# Patient Record
Sex: Female | Born: 2000 | Race: White | Hispanic: No | Marital: Single | Smoking: Never smoker
Health system: Southern US, Community
[De-identification: ages and names within clinical notes are randomized; demographics above are authoritative.]

---
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
## 2021-02-05 ENCOUNTER — Emergency Department: Payer: BC Managed Care – PPO

## 2021-02-05 ENCOUNTER — Emergency Department
Admission: EM | Admit: 2021-02-05 | Discharge: 2021-02-05 | Disposition: A | Discharge status: Home / Self Care | Payer: BC Managed Care – PPO | Attending: Emergency Medical Services | Admitting: Emergency Medical Services

## 2021-02-05 DIAGNOSIS — X58XXXA Exposure to other specified factors, initial encounter: Secondary | ICD-10-CM | POA: Insufficient documentation

## 2021-02-05 DIAGNOSIS — M84375A Stress fracture, left foot, initial encounter for fracture: Secondary | ICD-10-CM | POA: Insufficient documentation

## 2021-02-05 DIAGNOSIS — S93602A Unspecified sprain of left foot, initial encounter: Secondary | ICD-10-CM | POA: Insufficient documentation

## 2021-02-05 NOTE — Discharge Instructions (Addendum)
Ibuprofen 600mg  every 8 hours with food only as needed for pain.  Consider cushioned in-soles for shoes.  Rest, ice, elevation as discussed.  Call/see Podiatry as needed.    Thank you for choosing [facility] for your emergency care needs.  We strive to provide EXCELLENT care to you and your family.      If you do not continue to improve or your condition worsens, please contact your doctor or return immediately to the Emergency Department.    ONSITE PHARMACY  Our full service onsite pharmacy is a 2 minute walk from the ER.  Open Mon to Fri from 8 am to 8 pm, Sat and Sun 9 am to 5 pm. Ask an ED staff member for directions.  We accept all major insurances and prices are competitive with major retailers.  Ask your provider to print your prescriptions down to the pharmacy to speed you on your way home.      DOCTOR REFERRALS  Call [phone] (available 24 hours a day, 7 days a week) if you need any further referrals and we can help you find a primary care doctor or specialist.  Also, available online at:  [email or web address]    YOUR CONTACT INFORMATION  Before leaving please check with registration to make sure we have an up-to-date contact number.  You can call registration at [phone] to update your information.  For questions about your hospital bill, please call [phone].  For questions about your Emergency Dept Physician bill please call [phone].      FREE HEALTH SERVICES  If you need help with health or social services, please call 2-1-1 for a free referral to resources in your area.  2-1-1 is a free service connecting people with information on health insurance, free clinics, pregnancy, mental health, dental care, food assistance, housing, and substance abuse counseling.  Also, available online at:  http://www.[facility].org    MEDICAL RECORDS AND TESTS  Certain laboratory test results do not come back the same day, for example urine cultures.   We will  contact you if other important findings are noted.  Radiology films are often reviewed again to ensure accuracy.  If there is any discrepancy, we will notify you.      Please call [phone] to pick up a complimentary CD of any radiology studies performed.  If you or your doctor would like to request a copy of your medical records, please call [phone].          ORTHOPEDIC INJURY   Please know that significant injuries can exist even when an initial x-ray is read as normal or negative.  This can occur because some fractures (broken bones) are not initially visible on x-rays.  For this reason, close outpatient follow-up with your primary care doctor or bone specialist (orthopedist) is required.    MEDICATIONS AND FOLLOWUP  Please be aware that some prescription medications can cause drowsiness.  Use caution when driving or operating machinery.    The examination and treatment you have received in our Emergency Department is provided on an emergency basis, and is not intended to be a substitute for your primary care physician.  It is important that your doctor checks you again and that you report any new or remaining problems at that time.      24 HOUR PHARMACIES  [facility] [address], [place], [place] [address] (1.4 miles, 7 minutes)  Handout with directions available on request  ASSISTANCE WITH INSURANCE    Affordable Care Act  (ACA)  Call to start or finish an application, compare plans, enroll or ask a question.  1-800-318-2596  TTY: 1-855-889-4325  Web:  Healthcare.gov    Help Enrolling in Medicaid  Cover [place]  (855) 242-8282 (TOLL-FREE)  (888) 221-1590 (TTY)  Web:  Http://www.coverva.org    Local Help Enrolling in the ACA  [facility]  ([phone] 748-2580 (MAIN)  Email:  health-help@nvfs.org  Web:  Http://www.nvfs.org  Address:  [address] [place], [place] [address]

## 2021-02-05 NOTE — ED Provider Notes (Addendum)
EMERGENCY DEPARTMENT HISTORY AND PHYSICAL EXAM         [facility]        Patient Information       Patient Name: Pamela Montes, Pamela Montes  Encounter Date:  02/05/2021  Attending Physician: Alexander-Nicholas D. 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  Worse with ambulation and bearing weight.  There is no antecedent significant trauma.  No foot swelling.  No toe pain or swelling.  No pain at the heel or soles of feet.  No rash.  No fever or constitutional symptoms.  No ankle knee or hip pain.  To ambulate without assistance normally.      PMD:  Dexter-Rice, Tennis Must, MD      Nursing Notes Review   Nursing Notes were reviewed.     Previous Records Review   Previous records were reviewed to the extent practicable for the current presentation.      Review of Systems     Constitutional:  No fever  Eyes: No discharge   ENT: No ST  CV:  No CP   Resp:  No SOB or cough  GI: No abd pain, N, V  GU: No dysuria  MS:  +as per HPI  Skin: No rash  Neuro:  No HA  Psych:  No behavior changes  All other systems reviewed and negative    See HPI for review of systems that is relevant to the current presentation.   All other systems reviewed: negative.     Past Medical History     History reviewed. No pertinent past medical history.    Past Surgical History     History reviewed. No pertinent surgical history.    Family History     History reviewed. No pertinent family history.    Social History     She reports that she has never smoked. She has never been exposed to tobacco smoke. She has never used smokeless tobacco. She reports that she does not drink alcohol and does not use drugs.    History reviewed.  No pertinent family history.    Allergies     No Known Allergies    Home Medications     Home medications reviewed by ED MD at 12:26 PM    There are no discharge medications for this patient.        ED Medications Administered     ED Medication Orders (From admission, onward)      None              VS   No data found.      Physical Exam     Constitutional: Vital signs reviewed. Well appearing.  Head: Normocephalic, atraumatic  Eyes: Conjunctiva and sclera are normal.  No injection or discharge.   Ears, Nose, Throat:  Normal external examination of the nose and ears.    Neck: Normal range of motion. Trachea midline.   Respiratory/Chest:  No respiratory distress.     Cardiovascular: Regular rate and rhythm.   Abdomen: Soft  and non-distended  Upper Extremity:  No edema. No cyanosis. Nl ROM  Lower Extremity:  No edema. No cyanosis.  RLE:  nontender and FROM throughout  LLE: No hip tenderness.  Left knee is nontender with midline patella.  No effusion.  No laxity.  Lower leg compartments are soft.  Achilles tendon is normal.  No ankle swelling.  Midfoot and is tender.  No tenderness at the base of the fifth metatarsal.  Mild tenderness at the third fourth and fifth MTP joints but there is no swelling and no crepitus.  No erythema and warmth.  2+ distal pulses.  CSM intact.  Neurological:  Alert and conversant.  No focal motor deficits by observation; MAE, 5/5 strength x4 ext. Speech normal.  Skin: Warm and dry. No rash.  Psychiatric:  Normal affect.  Normal insight.        Diagnostic Study Results     ECG:      Labs     Results       ** No results found for the last 24 hours. **            Radiologic Studies  Radiology Results (24 Hour)       Procedure Component Value Units Date/Time    Foot Left AP Lateral and Oblique [[id number]] Collected: 02/05/21 1412    Order Status: Completed Updated: 02/05/21 1415    Narrative:      HISTORY: foot pain     COMPARISON: None.    FINDINGS:     There is some cortical thickening at the  medial aspect of the fourth  metatarsal diaphysis. There is anatomic alignment throughout with normal  joint spaces.      Impression:       Cortical thickening of the fourth metatarsal, consistent  with healing stress fracture    Genelle Bal   02/05/2021 2:13 PM        .    Data Review     Laboratory results reviewed by ED provider:  N/A  Radiologic study results reviewed by ED provider:  Yes    MDM and Clinical Notes                        Critical Care     na      Diagnosis and Disposition     Rendering Provider: Alexander-Nicholas Marina Gravel, MD    Clinical Impression(s):  1. Stress fracture of left foot, initial encounter    2. Foot sprain, left, initial encounter        Disposition  ED Disposition       ED Disposition   Discharge    Condition   --    Date/Time   Fri Feb 05, 2021  2:17 PM    Comment   Jennett Tarbell Grose discharge to home/self care.    Condition at disposition: Stable                 Prescriptions    There are no discharge medications for this patient.          *This note was generated by the Epic EMR system/ Dragon speech recognition and may contain inherent errors or omissions not intended by the user. Grammatical errors, random word insertions, deletions, pronoun errors and incomplete sentences are occasional consequences of this technology due to software limitations. Not all errors are caught or corrected. If there are questions or concerns about the content of this note or information contained  within the body of this dictation they should be addressed directly with the author for clarification.Terance Ice, Alexander-Nicholas D, MD  02/05/21 1430       Phi Avans, Alexander-Nicholas D, MD  03/11/21 [phone]

## 2021-02-05 NOTE — ED Triage Notes (Signed)
Pt reports reports right foot pain for a month but has been getting worse

## 2021-03-18 ENCOUNTER — Other Ambulatory Visit: Payer: Self-pay | Admitting: Foot and Ankle Surgery

## 2021-03-18 DIAGNOSIS — M25371 Other instability, right ankle: Secondary | ICD-10-CM

## 2021-03-18 DIAGNOSIS — S8261XS Displaced fracture of lateral malleolus of right fibula, sequela: Secondary | ICD-10-CM

## 2021-03-26 ENCOUNTER — Ambulatory Visit (INDEPENDENT_AMBULATORY_CARE_PROVIDER_SITE_OTHER): Payer: BC Managed Care – PPO | Admitting: Emergency Medicine

## 2021-03-26 ENCOUNTER — Encounter (INDEPENDENT_AMBULATORY_CARE_PROVIDER_SITE_OTHER): Payer: Self-pay | Admitting: Emergency Medicine

## 2021-03-26 VITALS — BP 118/74 | HR 98 | Temp 98.9°F | Resp 18 | Ht 64.0 in | Wt 145.0 lb

## 2021-03-26 DIAGNOSIS — R058 Other specified cough: Secondary | ICD-10-CM

## 2021-03-26 DIAGNOSIS — R051 Acute cough: Secondary | ICD-10-CM

## 2021-03-26 MED ORDER — ALBUTEROL SULFATE HFA 108 (90 BASE) MCG/ACT IN AERS
2.0000 | INHALATION_SPRAY | RESPIRATORY_TRACT | 0 refills | Status: AC | PRN
Start: 2021-03-26 — End: 2021-05-25

## 2021-03-26 MED ORDER — AMOXICILLIN-POT CLAVULANATE 875-125 MG PO TABS
1.0000 | ORAL_TABLET | Freq: Two times a day (BID) | ORAL | 0 refills | Status: AC
Start: 2021-03-26 — End: 2021-04-02

## 2021-03-26 NOTE — Progress Notes (Signed)
URGENT  CARE  PROGRESS NOTE     Patient: Pamela Montes   Date: 03/26/2021   MRN: [id number]       Pamela Montes is a 21 y.o. female      HISTORY     History obtained from: Patient    Chief Complaint   Patient presents with    Cough     Pt reports cough, congestion and ear pain x54mth        HPI   21 year old female presents to the clinic with 1 month of nasal congestion and cough, no recent fevers.  Some right ear pain.  Has been attempting relief with Mucinex and ibuprofen Profen without much success.  Denies history of allergies.      Review of Systems    History:    Pertinent Past Medical, Surgical, Family and Social History were reviewed.        Current Outpatient Medications:     albuterol sulfate HFA (Proventil HFA) 108 (90 Base) MCG/ACT inhaler, Inhale 2 puffs into the lungs every 4 (four) hours as needed for Wheezing or Shortness of Breath, Disp: 1 each, Rfl: 0    amoxicillin-clavulanate (AUGMENTIN) 875-125 MG per tablet, Take 1 tablet by mouth 2 (two) times daily for 7 days, Disp: 14 tablet, Rfl: 0    No Known Allergies    Medications and Allergies reviewed.    PHYSICAL EXAM     Vitals:    03/26/21 1333   BP: 118/74   BP Site: Left arm   Patient Position: Sitting   Cuff Size: Large   Pulse: 98   Resp: 18   Temp: 98.9 F (37.2 C)   TempSrc: Tympanic   SpO2: 99%   Weight: 65.8 kg (145 lb)   Height: 1.626 m (5\' 4" )       Physical Exam  Constitutional:       General: She is not in acute distress.     Appearance: She is well-developed.   HENT:      Head: Normocephalic and atraumatic.      Right Ear: Tympanic membrane and external ear normal.      Left Ear: Tympanic membrane and external ear normal.      Mouth/Throat:      Pharynx: No oropharyngeal exudate.     Eyes: Conjunctivae are normal. Right conjunctiva is not injected. Left conjunctiva is not injected. No scleral icterus. Cardiovascular:      Rate and Rhythm: Normal rate and regular rhythm.      Heart sounds: Normal heart sounds. No murmur  heard.  Pulmonary:      Effort: Pulmonary effort is normal. No respiratory distress.      Breath sounds: Normal breath sounds. No wheezing.   Musculoskeletal:      Cervical back: Normal range of motion and neck supple.   Lymphadenopathy:      Cervical: No cervical adenopathy.   Neurological:      Mental Status: She is alert and oriented to person, place, and time.   Skin:     General: Skin is warm and dry.      Findings: No rash.   Vitals and nursing note reviewed.       UCC COURSE     There were no labs reviewed with this patient during the visit.    There were no x-rays reviewed with this patient during the visit.    No current facility-administered medications for this visit.  PROCEDURES     Procedures    MEDICAL DECISION MAKING     History, physical, labs/studies most consistent with postviral cough syndrome as the diagnosis.    Chart Review:  Prior PCP, Specialist and/or ED notes reviewed today: Not Applicable  Prior labs/images/studies reviewed today: Not Applicable    Differential Diagnosis: Sinusitis, Bronchitis, Pharyngitis, Pneumonia, Influenza, COVID-19 and Allergic Rhinitis        ASSESSMENT     Encounter Diagnoses   Name Primary?    Acute cough Yes    Post-viral cough syndrome             PLAN      PLAN:     21 year old female presents the clinic with 1 month of URI symptoms.  There continues to be no evidence of a bacterial infection at this time.  She is prescribed albuterol inhaler to help with cough and recommend she continue Mucinex and start Afrin nasal spray for 3 to 4 days.  If there still is no improvement she has a prescription for Augmentin.  Follow-up primary care as needed      Orders Placed This Encounter   Procedures    [facility] ID Now SARS-COV-2 POCT     Requested Prescriptions     Signed Prescriptions Disp Refills    albuterol sulfate HFA (Proventil HFA) 108 (90 Base) MCG/ACT inhaler 1 each 0     Sig: Inhale 2 puffs into the lungs every 4 (four) hours as needed for Wheezing or  Shortness of Breath    amoxicillin-clavulanate (AUGMENTIN) 875-125 MG per tablet 14 tablet 0     Sig: Take 1 tablet by mouth 2 (two) times daily for 7 days       Discussed results and diagnosis with patient/family.  Reviewed warning signs for worsening condition, as well as, indications for follow-up with primary care physician and return to urgent care clinic.   Patient/family expressed understanding of instructions.     An After Visit Summary was provided to the patient.

## 2021-04-14 IMAGING — CR DX Fingers RT
1 series · 3 of 3 positions shown · non-contrast
Comparison: None

HISTORY: Status post injury
TECHNIQUE: 3 views of the right second finger

[Series 2: pa · 0.10mm/px · 3 of 3 slices shown]
[im 1/3]
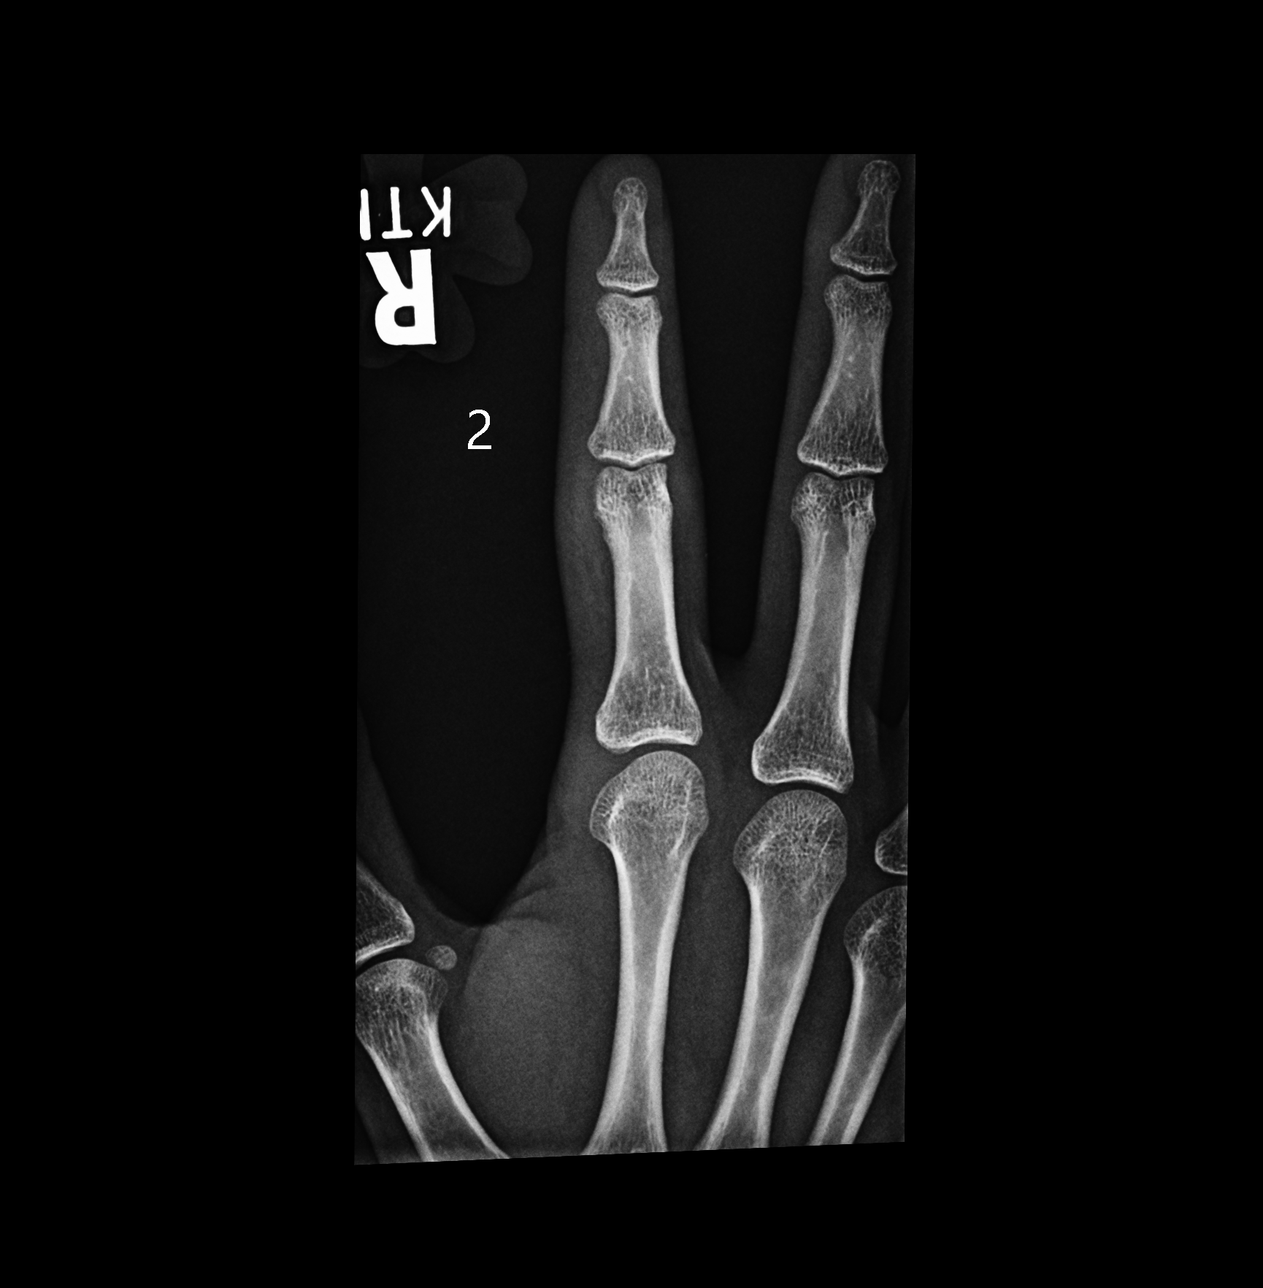
[im 2/3]
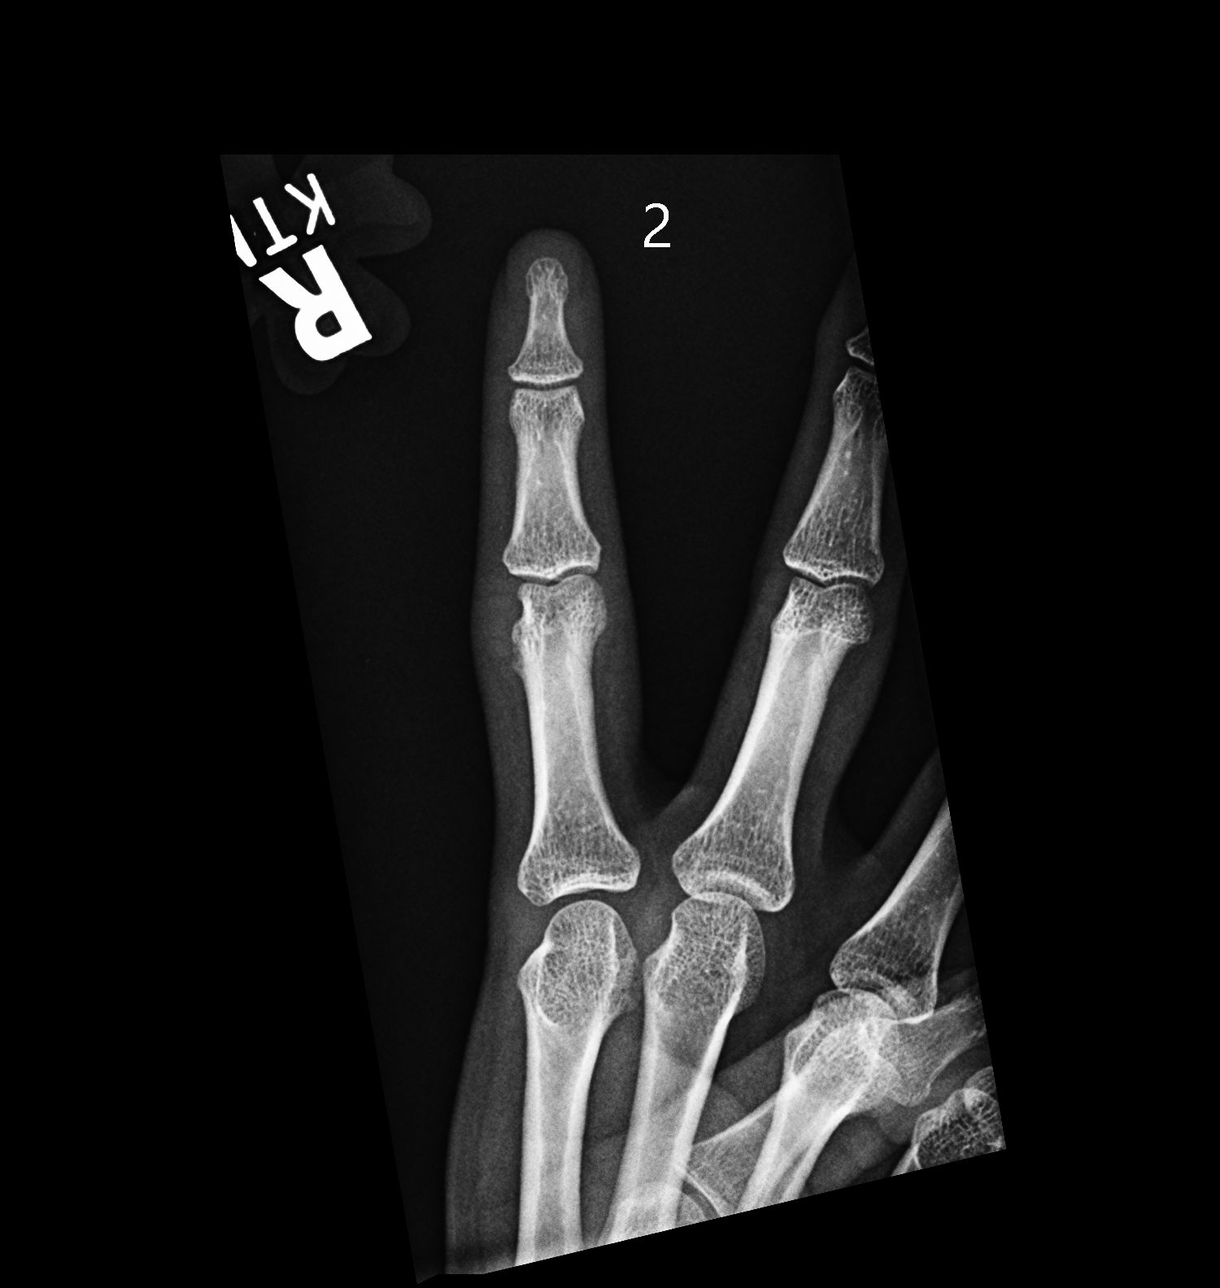
[im 3/3]
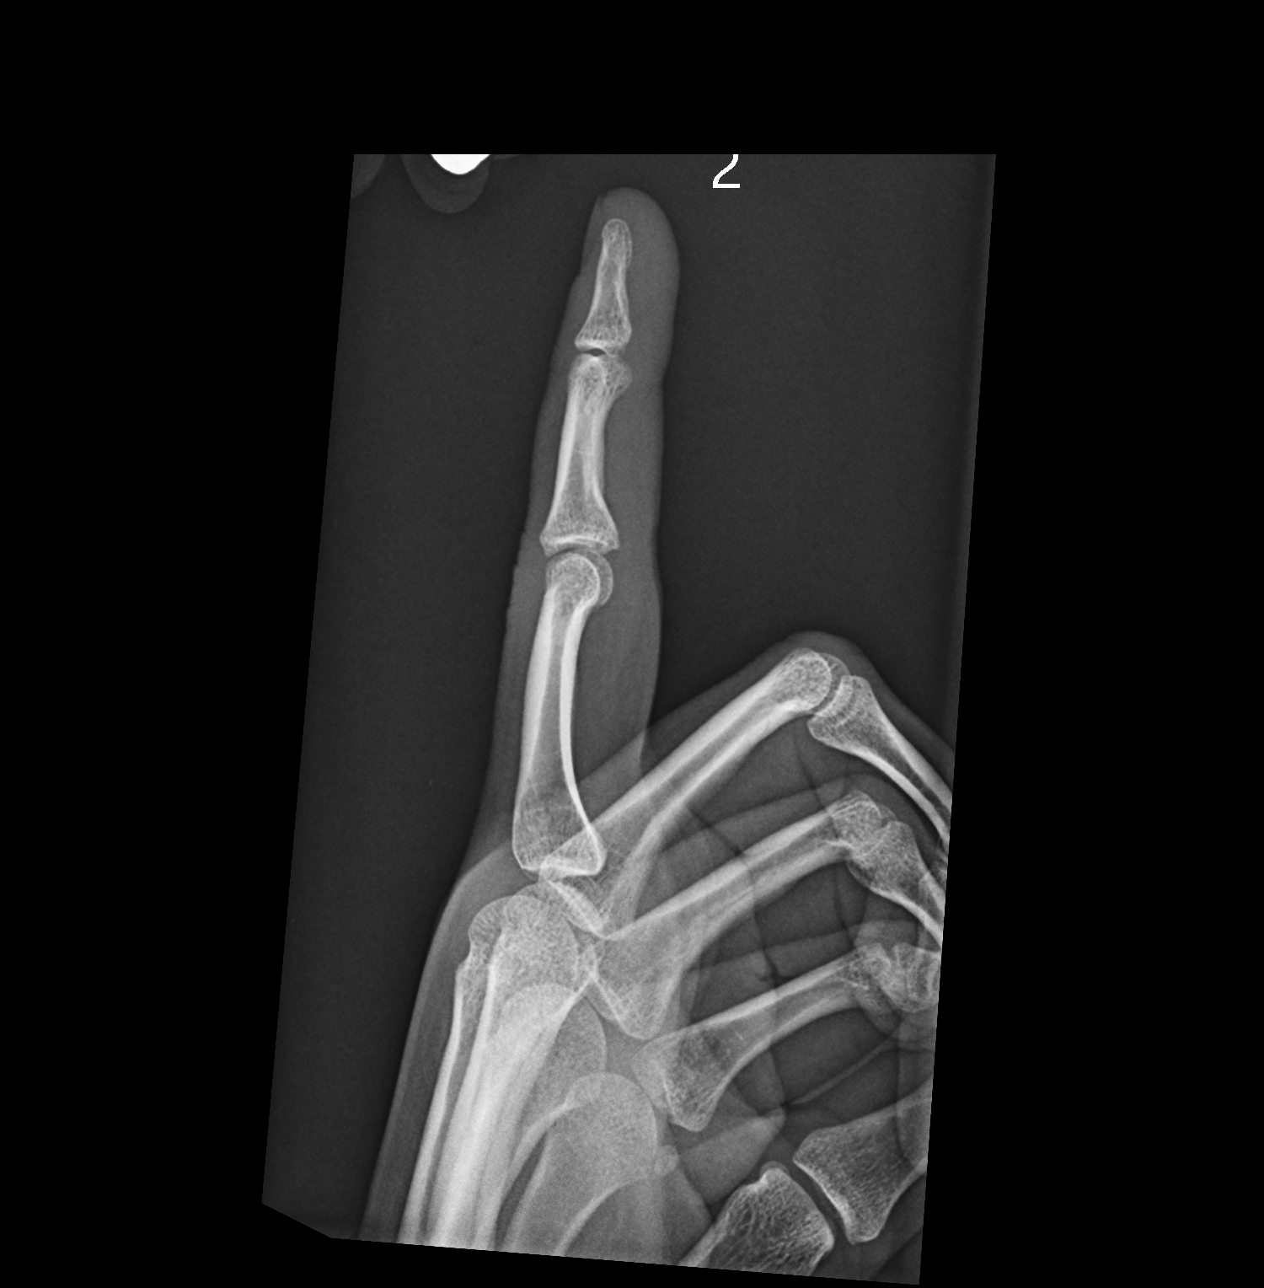

[3 of 3 positions shown; findings below may reference images not displayed]

FINDINGS: Soft tissue swelling about the PIP joint. No acute fracture or dislocation is             
 identified on x-ray. The joint spaces and bony contours are within normal                 
 limits.

## 2021-08-02 ENCOUNTER — Telehealth (INDEPENDENT_AMBULATORY_CARE_PROVIDER_SITE_OTHER): Payer: Self-pay | Admitting: MS"

## 2021-08-02 NOTE — Telephone Encounter (Signed)
I spoke with Pamela Montes  [name] who saw [facility] for genetic counseling. They would like for their daughters to be tested for Lynch Syndrome due to a Family history of PMS2 Mutation.     I told Pamela Montes that we would need a referral from a [place] licensed doctor before scheduling a consultation.      Once we have their doctors confirmation, and referral we can get them scheduled for a genetics consultation.     [name] and [name] has given permission to use their genetic test information for the genetics appointment.The family history questionnaire has already been completed in 2021 and is recently updated.      Lauraine Rinne  Clinical Assistant  [phone]  [phone]

## 2021-08-03 ENCOUNTER — Telehealth (INDEPENDENT_AMBULATORY_CARE_PROVIDER_SITE_OTHER): Payer: Self-pay | Admitting: MS"

## 2021-08-03 NOTE — Telephone Encounter (Signed)
Pamela Montes was referred to genetic counseling by Dr. Ahmed Prima on 08/02/21.     Confirmation from their referring physician obtained via phone call on 08/03/21.    Marena Chancy   Cancer Genetics Program Specialist  [phone]  [email or web address]  Fax: [phone]

## 2021-09-08 ENCOUNTER — Ambulatory Visit (INDEPENDENT_AMBULATORY_CARE_PROVIDER_SITE_OTHER): Payer: BC Managed Care – PPO | Admitting: MS"

## 2021-09-08 ENCOUNTER — Other Ambulatory Visit (INDEPENDENT_AMBULATORY_CARE_PROVIDER_SITE_OTHER): Payer: Self-pay | Admitting: MS"

## 2021-09-08 ENCOUNTER — Other Ambulatory Visit: Payer: BC Managed Care – PPO

## 2021-09-08 ENCOUNTER — Encounter (INDEPENDENT_AMBULATORY_CARE_PROVIDER_SITE_OTHER): Payer: Self-pay | Admitting: MS"

## 2021-09-08 DIAGNOSIS — Z800 Family history of malignant neoplasm of digestive organs: Secondary | ICD-10-CM

## 2021-09-08 DIAGNOSIS — Z1509 Genetic susceptibility to other malignant neoplasm: Secondary | ICD-10-CM

## 2021-09-08 LAB — GENETICS COUNSELING TEST COLLECTION KIT (FOR ISCI ONLY)

## 2021-09-08 NOTE — Progress Notes (Signed)
Pre-Test Genetic Counseling Note    Patient: Pamela Montes  DOB: [birth year]  Age: 21 y.o.    Reason for referral  Samika Vetsch is a 21 y.o. year-old female of European ancestry who is referred by Dr. 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  The patient was accompanied to her session by her mother and her sisters and was seen for consultation lasting approximately 45 minutes on 09/08/2021.    Medical History: No significant medical history was reported.    Family History:     Family History   Cancer History Relation Age of Onset    Skin cancer Maternal Grandfather         Basal and squamous cell - Lynch PMS2 positive    Bladder Cancer Paternal Grandmother 101    Uterine cancer Paternal Grandmother 38    Basal cell carcinoma Maternal Aunt     Colon cancer Maternal Uncle 56        PMS2 positive    Breast cancer Paternal Great-Grandmother 67    Thyroid cancer Paternal Great-Grandmother 15    Lymphoma Paternal Great-Grandmother 2    Colon cancer Paternal Great-Grandmother 35     Maternal ancestry: [place], [place] and [place]  Paternal ancestry: [place] and [place]     Please see pedigree for additional details of the family structure.    Genetic testing was first pursued in this family by Otilio Miu Ngo's mother. The result report was available for my review as she was tested at [facility]. The specific test pursued was a 95-gene panel through [facility] and the result shows a mutation in the PMS2 gene known as [id number].This mutation is in ClinVar by several other laboratories with the same classification as pathogenic.      Assessment & Content of Discussion  Takeyah Wieman is a candidate for genetic testing for the known familial PMS2 mutation previously identified in her mother. The likelihood that Trena Dunavan Politte shares this mutation is 50%.    PMS2 gene mutations  cause Lynch syndrome. I explained that Lynch syndrome is a hereditary cancer syndrome that is typically characterized by multiple family members affected with early onset colon cancer (generally diagnosed in individuals under the age of 43), and the presence of other types of malignancies such as endometrial (uterine) and ovarian cancer in women, as well as stomach and genitourinary cancers.      The reported maternal family history of colon cancer is consistent with PMS2.    Possible test results for [facility] include positive (meaning that she shares the mutation) or negative (meaning that she does not).  A positive test result would significantly impact her medical management in the near future.     CANCER RISKS IN PMS2 MUTATION CARRIERS   Women and men who carry mutations in the PMS2 gene are at a significantly increased risk for developing several types of cancer associated with Lynch syndrome.     There is evidence of important variability in cancer risk among different families, even within the same variant in a specific Lynch syndrome causing gene. This variability may be due to shared biologic (e.g. genetic risk modifiers) and/or social and behavioral exposures. Thus, when assessing individual cancer risks, it is important to consider specific family history of cancer and factors shown to be associated with colorectal cancer risk, including key exposures (e.g. tobacco, alcohol), diet (e.g processed and  red meat consumption) and lifestyle factors (e.g physical exercise) Museum/gallery exhibitions officer, Lancet Oncol 2021; 22: [id number]).     Below are cancer risk estimates in individuals with Lynch syndrome compared to the general population.  The following lifetime cancer risk information is adapted from the current [employer] (NCCN) guidelines.      Cancer Site Cumulative Risk for Diagnosis Through Lifetime for the General Population Cumulative Risk for  Diagnosis through age 105y in PMS2 Mutation Carriers ** Estimated Average Age of Presentation in PMS2 Mutation Carriers  Comments    Colorectal   4.1%  8.7% - 20%  61-66 years Risk of first colon cancer    Endometrium  3.1%  13% - 26%  49-50 years      Ovary 1.1% 3% 51-59 years     Stomach  <1%  Inadequate data Inadequate data    Pancreas 1.7% <1% - 1.6% N/A    Bladder 2.3% <1% - 2.4% 71 years    Biliary Tract  N/A <1%  N/A    Renal pelvis and/or ureter  N/A <1% - 3.7% N/A     Small Bowel  <1%  <1%  Single case- 59 years    Prostate 12.6% 4.6% - 11.6% N/A    Brain / CNS  <1% <1% 40 years     N/A = Not available  The panel cautions that new data may confirm or change prior findings suggesting no increased risk, as more studies are needed to clarify lifetime risks for cancer in Lynch syndrome by mutation type. Point estimates for cancer risk in many studies were associated with wide confidence intervals, and should be interpreted with caution.   **Cumulative risk among LS pathogenic variant carriers represents cumulative incidence based on available cohort studies. In some studies, the cumulative risks are through a younger age (I.e. 3 or 94). For some cancer sites, case series and other observational studies may have reported higher cumulative risks. In studies where no cases were identified, NCCN has represented the data as <1%.     There are insufficient data supporting an increased risk for breast cancer for women with Lynch syndrome. As a result, breast cancer is not included on the Lynch syndrome increased cancer risks table. Breast cancer risk management should be based on personal and family history.     Discussion of Lynch syndrome medical management guidelines  We reviewed currently recommended management options for PMS2 mutation carriers including frequent colonoscopy and the option of uterine/ovarian cancer surveillance and/or risk reduction in women and the goals and limitations of each of these options.  These screening recommendations would be provided to [facility] if she is found to have inherited the known PMS2 familial mutation.     The most recent version of the NCCN guidelines recommend some consideration of gene-specific risks when planning screening and/or prevention. Although there are some mutation-specific data available, a generalized screening approach is suggested for many surveillance strategies. Screening and the option of risk-reducing surgeries should be individualized after risk assessment and counseling.  Please see below for details. It is important to note that, other than for colon and endometrial cancer, screening recommendations are based on expert opinion rather than evidence-based. Additional cancer screening may also be recommended as per NCCN guidelines, as summarized below.      Colon Cancer Screening:   Colonoscopy may not be able to prevent all CRC in individuals with Lynch syndrome Gordy Clement et al, 2022). It has been hypothesized that  this may be because some cancers do not form an intermediate adenoma Binnie Rail et al, 2018). However, available data have shown that exposure to colonoscopy can detect cancers at an early stage when they are more likely curable. Risk reducing colectomy may be discussed with a colorectal surgeon.  High-quality colonoscopy starting at age 20-35 y or 56-5 y prior to the earliest colon cancer if it is diagnosed before age 48 y and repeat every 1-3 years.There is little evidence to guide the timing of initiating screening relative to the youngest age of diagnosis in a relative and the timing should be individualized. It was discussed in detail that the primary screening for colon cancer would not commence until the age of 37.   Patients who may benefit from a shorter 1-year versus longer 2-year interval include those with risk factors such as history of colorectal cancer, female sex, MLH1/MSH2 mutations, age >40 years, and/or history of  adenoma(s).  NCCN recommends that all individuals with Lynch syndrome who have a risk for future colorectal cancer (I.e. excluding those with prior total proctocolectomy) consider using daily aspirin to reduce their future risk of colorectal cancer. The decision to use aspirin for reduction of colorectal cancer risk in Lynch syndrome and the dose chosen should be made on an individual basis, including discussion of individual risks, benefits, adverse effects, and childbearing plans. In determining whether an individual with Lynch syndrome should take aspirin and in deciding on the appropriate dosing, NCCN recommends that providers carefully review patient-specific factors that may increase the risk of aspirin therapy - including, but not limited to increased age, prior allergy, concurrent use of antiplatelets/anticoagulants, and untreated H. pylori or unconfirmed H. pylori eradication - as well as patient-specific factors that indicate a comparably low future cumulative risk of colorectal cancer (I.e. increased age, PMS2-associated Lynch syndrome, history of prior colectomy) and who may thus be less likely to experience significant benefit. We encourage Ledia Hanford Calbert to review this information in detail with her physician(s).   Endometrial Cancer Screening and Risk Reduction:  PMS2 mutation carriers appear to be at only a modestly increased risk of endometrial cancer in contrast to other Lynch syndrome genes.   Because endometrial cancer can often be detected early based on symptoms, women should be educated regarding the importance of prompt reporting and evaluation of any abnormal uterine bleeding or postmenopausal bleeding. The evaluation of symptoms should include endometrial biopsy.   Hysterectomy has not been shown to reduce endometrial cancer mortality, but can reduce the incidence of endometrial cancer; therefore, hysterectomy is a risk reducing option that can be considered.   Timing of hysterectomy  can be individualized based on whether childbearing is complete, co-morbidities, family history, and the Lynch syndrome gene, since endometrial cancer risks vary by pathogenic variant (see above for PMS2 gene specific risks).   Endometrial cancer screening does not have proven benefit in women with Lynch syndrome. However, endometrial biopsy is both highly sensitive and highly specific as a diagnostic procedure. Screening via endometrial biopsy every 1-2 years starting at age 13-35 y can be considered.  Transvaginal ultrasound to screen for endometrial cancer in postmenopausal women has not been shown to be sufficiently sensitive or specific as to support a positive recommendation, but may be considered at the clinician's discretion. Transvaginal ultrasound is not recommended as a screening tool in premenopausal women due to the wide range of endometrial stripe thickness throughout the normal menstrual cycle.     Ovarian Cancer screening and Risk Reduction:  Insufficient  evidence exists to make a specific recommendation for risk-reducing salpingo-oophorectomy (RRSO) for PMS2 pathogenic variant carriers. PMS2 carriers appear to be at no greater than average risk for ovarian cancer, and may consider deferring surveillance and may reasonably elect not to have oophorectomy.   Bilateral salpingo-oophorectomy (BSO) may reduce the incidence of ovarian cancer. The decision to undergo a BSO as a risk-reducing option by women who have completed childbearing should be individualized and done with consultation with a gynecologist with expertise in Lynch syndrome. Timing of BSO should be individualized based on whether childbearing is complete, menopause status, comorbitities, family history, and Lynch syndrome gene pathogenic variant, since ovarian cancer risks vary by mutated gene. (see above for PMS2 gene specific risks). Estrogen replacement after premenopausal oophorectomy may be considered.    Since there is no effective  screening for ovarian cancer, women should be educated on the symptoms that might be associated with the development of ovarian cancer, including pelvic/abdominal pain, bloating, increased abdominal girth, difficulty eating, early satiety, or urinary frequency or urgency. Symptoms that persist for several weeks and are a change from a woman's baseline should prompt evaluation by her physician.   While there may be circumstances where clinicians find screening helpful (such as if a family history of ovarian cancer is present), data do not support routine ovarian cancer screening for Lynch syndrome. Transvaginal ultrasound for ovarian cancer screening has not been shown to be sufficiently sensitive or specific as to support a routine recommendation, but may be considered at the clinician's discretion. Serum CA-125 is an additional ovarian screening test with caveats similar to transvaginal ultrasound.   Consider risk reduction agents for endometrial and ovarian cancers, including discussion of risks and benefits. Data suggest that prior use of oral contraceptives reduces the risk for developing ovarian cancer in other mutation carriers (i.e. BRCA) by about 50%, although this has not been specifically studied in the context of Lynch syndrome. It is important to review the associated risk and benefits, in particular conflicting data linking their use to an increased risk of developing breast cancer.     Gastric & Small Bowel Cancer Screening:   Consider upper GI surveillance with EGD starting at age 110-40 years and repeat every 2-4 years, preferably performed in conjunction with colonoscopy. Age of initiation prior to 30 years and/or surveillance interval less than 2 years may be considered based on family history of upper GI cancers or high-risk endoscopic findings (such as incomplete or extensive gastric intestinal metaplasia (GIM), gastric or duodenal adenomas, or Barrett esophagus with dysplasia). Random biopsy of  the proximal and distal stomach should at minimum be performed on the initial procedure to assess for H.pylori (with treatment indicated if H.pylori is detected), autoimmune gastritis, and intestinal metaplasia. Push enteroscopy can be considered in place of EGD to enhance small bowel visualization, although its incremental yield for detection of neoplasia over EGD remains uncertain. There are limited available data on upper GI cancer risk in PMS2-related Lynch syndrome, and new evidence is likely to inform changes to these recommendations in the future.   Individuals not undergoing upper endoscopic surveillance should have one-time noninvasive testing for H.pylori at the time of Lynch syndrome diagnosis, with treatment indicated if H.pylori is detected. The value of eradication for the prevention of gastric cancer in Lynch syndrome is unknown.     Urothelial Cancer Screening:   There is no clear evidence to support surveillance for urothelial cancers in individuals with Lynch syndrome. Surveillance may be considered in selected individuals,  such as those with a family history of urothelial (bladder, kidney, ureter) cancer. Surveillance options may include annual urinalysis starting at age 130-35y. However, there is insufficient evidence to recommend a particular surveillance strategy.     Central Nervous System Cancer Screening:   Patients should be educated regarding signs and symptoms of neurologic cancer and the importance of prompt reporting of abnormal symptoms to their physicians. The possible symptoms may include, but are not limited to: headache, nausea, vomiting, blurred vision, balance problems, personality or behavior changes, seizures, drowsiness or even coma.     Pancreatic Cancer Screening:   NCCN does not currently recommend pancreatic cancer screening for carriers of PMS2 gene mutations.     Breast Cancer Screening:   There have been suggestions that there is an increased risk for breast cancer in  Lynch syndrome patients; however, there is not enough evidence to support increased screening above average-risk breast cancer screening recommendations or those based on personal/family history of breast cancer.    Skin Cancer Screening:   Frequency of malignant and benign skin tumors such as sebaceous adenocarcinomas, sebaceous adenomas, and keratoacanthomas has been reported to be increased among patients with Lynch syndrome, but cumulative lifetime risk and median age of presentation are uncertain.  Further, an elevated risk of sebaceous tumors and keratoacanthoma has not been documented for PMS2 carriers.   Consider skin exam every 1-2 years with a health care provider skilled in identifying Lynch syndrome-associated skin manifestations. Age to start surveillance is uncertain and can be individualized.     Prostate Cancer Screening  Men with Lynch syndrome should consider their risk based on the Lynch syndrome gene and family history of prostate cancer. The NCCN Guidelines for Prostate Cancer Early Detection recommend that it is reasonable for men with Lynch syndrome to consider beginning shared decision-making about prostate cancer screening (PSA and Digital Rectal Exam (DRE)) at age 20 years and to consider screening at annual intervals rather than every other year.   If Otilio MiuHannah Mary Polyakov does share the PMS2 mutation, her children would each have a 50% chance of inheriting it. There are options for prenatal diagnosis and assisted reproductive technologies, including pre-implantation genetic diagnosis (PGD) that we can review further as needed.  In most cases, genetic testing for cancer susceptibility is not recommended for individuals under age 20 years unless the result will alter their medical management before this age. If Otilio MiuHannah Mary Phaneuf does not share the PMS2 mutation her children would not need to consider genetic testing for this mutation.     We discussed the importance of prenatal genetic  counseling [facility], if she is found to have inherited the familial PMS2 mutation, to include a review of her partner's personal and family histories, given that inheritance of two PMS2 mutations is associated with a rare (recessive) condition known as constitutional mismatch repair-deficiency (CMMR-D) syndrome.     A negative PMS2 result for Otilio MiuHannah Mary Keel would suggest that the risk for Lynch syndrome-associated cancers are not as significantly increased.      Otilio MiuHannah Mary Savidge is a candidate for site specific PMS2 genetic testing, targeted to the known familial mutation. However, given her paternal family history, we reviewed the availability of multi-gene panels and the varying levels of cancer risk associated with different genes. Broadly, we consider some genes to be "high risk" while others cause a more "moderate" level of risk.  Lastly, there are some "newly described" genes for which the level of cancer risk is  not yet well understood.     In particular, it was discussed that her PGM is likely also going to be tested for Lynch syndrome given 2 possibly related LS cancers.  In addition, their PGGM has had as reported multiple primary cancers, but these relatives are more distantly removed. We discussed that the patient could still just be tested for the known PMS2 mutation and that we can wait on any other information with regard to her PGM's GT and/or if a mutation was identified, her father could consider testing prior to the patient having a more expansive panel. However, she wanted to pursue the larger panel given that she was already planning to have testing.  This decision was made in conjunction with her mother who was present during ou GC session.      We reviewed possible genetic test results, including positive, negative and inconclusive. Much of our discussion focused on the medical implications of a positive genetic test result, including risk(s) for possibly more than  one type of cancer and the options for management of increased cancer risks including elevated surveillance, risk-reducing surgery and chemoprevention.  I explained that effective screening and/or risk-reducing options may not exist for all of the increased cancer risks associated with the hereditary cancer susceptibility genes.     Otilio Miu Carre understood the limitations in some of the currently available data, that recommendations for cancer screening and/or risk reduction are tailored to both the genetic test result and the personal/family history, and that her age and other factors need to be taken into account when trying to determine the degree of benefit that she may derive from risk-reducing surgery.      We discussed the fact that "negative" test results are of value since a mutation has already been identified in the family. There is a significant population risk for cancer, and there may be unidentified genes responsible for the observed personal and family history.  Increased cancer surveillance (or prevention interventions) may be appropriate even after negative results, based upon empiric estimates of risk. There is a chance that a mutation in a particular cancer susceptibility gene may not be detected, even if one is present, due to either technical limitations or laboratory error.     I also explained that the test result may identify a variant of uncertain significance.  These "variants" may or may not be associated with an increased cancer risk, and the optimal management of families transmitting such variants has not been defined.  Approximately 90% of variants of uncertain significance are ultimately reclassified as normal (not associated with an increased cancer risk).  Screening and prevention recommendations are therefore based on someone's personal and family histories.    We specifically discussed the Common Hereditary Cancer Panel: 47 genes from [facility].  With a panel like this  there is an approximately 25% likelihood of finding a variant of unknown significance in one or more of the genes included.      I also discussed our policies for documentation of genetic test results in the medical record and reviewed the current protections against insurance and employment discrimination based on genetic test results and/or family history.    Psychosocial Considerations  Genetic testing may have significant psychological implications for both individuals and families.  During the session, Hodan Wurtz Radney demonstrated understanding of the material presented. She raised appropriate questions, which were answered to the best of my ability. The decision for testing was made autonomously by the patient with significant input and  discussion from me and from her mother.  Her mother noted that they had already had several discussions about genetic testing prior and her parents are supportive of her decision making and being able to plan accordingly for medical management based on a positive result - understanding that these recommendations may evolve and change over time.       Plan for genetic testing  After this discussion, Aalaya Yadao Salamone pursed testing. She provided a blood sample for testing, which was sent to [facility].      The specific test ordered is the 47-gene panel.  Expected turn-around-time for the test is 2 to 3 weeks.     The insurance and billing process was reviewed.     Screening recommendations and plan for follow-up  I will discuss screening and prevention recommendations when genetic test results are available to inform that discussion.     Conclusion   I remain available for any additional questions that arise while we wait for genetic test results.   I will see Kathlynn Swofford Barbone for an in person results disclosure on 09/29/21.    As has been discussed with the patient, if she is unavailable and can't be reached on multiple occasions, I will disclose the result directly  to the referring physician.    Albirta Rhinehart A. Charmel Pronovost, MS, CGC  Certified and Licensed Press photographer, Pensions consultant [facility]  A Department of [facility]  [address]  [address]  [place], [place] [address]  [phone] - phone  [phone] - fax  [email or web address]

## 2021-09-29 ENCOUNTER — Ambulatory Visit (INDEPENDENT_AMBULATORY_CARE_PROVIDER_SITE_OTHER): Payer: Self-pay | Admitting: MS"

## 2021-10-18 ENCOUNTER — Encounter (INDEPENDENT_AMBULATORY_CARE_PROVIDER_SITE_OTHER): Payer: Self-pay | Admitting: MS"

## 2021-10-18 NOTE — Progress Notes (Signed)
Genetic Test Result Disclosure Note    Patient: Pamela Montes  DOB: 07/11/2000  Age: 21 y.o.    HISTORY  Pamela Montes underwent a 47-gene Common Hereditary Cancer with RNA genetic testing panel at [facility].      I contacted Pamela Montes and her mother to review these results by phone on 10/18/21.      A copy of the genetic test result report was provided to Pamela Montes.    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   The patient had a 50% chance of having inherited the PMS2 mutation given that it was previously identified in her mother.      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 At the present time it is not known whether this variant is associated with increased cancer risk or not. If/when we receive additional information from the laboratory regarding the significance of this variant we will provide that to Pamela Montes.      Both the PMS2 mutation and the MLH1 variant are in the [employer] and the classifications are consistent amongst the reputable genetic testing laboratories that have submitted data.          RESULT INTERPRETATION   The interpretation of Pamela Montes's genetic test results is provided within the context of her reported personal and family history and based on current scientific understanding.  Rarely, the classification of a mutation may change as new data becomes available.    The identified PMS2 mutation is consistent with a diagnosis of Lynch syndrome for Pamela Montes.  Lynch syndrome is a hereditary cancer syndrome that is typically characterized by multiple family members affected with early onset colon cancer (generally diagnosed in individuals under the age of 42), and the presence of other types of malignancies such as endometrial (uterine) and ovarian cancer in women, as well as stomach  and genitourinary cancers.      Please see immediately below for a discussion of cancer risks associated with PMS2 gene mutations and medical management implications for Pamela Montes.      It is important to note that Pamela Montes's first-degree relatives (siblings) are at 50% risk for having inherited the PMS2 mutation and the associated cancer risks.  Please see below for further information and recommendations for relatives.  Individualized genetic counseling is strongly recommended for at-risk family members to clarify risk and to define the appropriateness of genetic testing.     While the likelihood is extremely small, there is always the possibility of human or laboratory error in performing a genetic test. The laboratories we utilize take every effort to maximize the accuracy of the tests performed. Since genetic test results have important implications for medical management, patients may wish to consider having the test repeated by submitting a fresh sample. Repeat testing may be particularly useful for unaffected individuals who are considering risk-reducing surgery based on positive results, or for individuals who test negative for a known mutation in the family.     Medical History: No significant medical history was reported.     Family History:            Family History   Cancer History Relation Age of Onset    Skin cancer Maternal Grandfather           Basal and squamous cell -  Lynch PMS2 positive    Bladder Cancer Paternal Grandmother 31    Uterine cancer Paternal Grandmother 5    Basal cell carcinoma Maternal Aunt      Colon cancer Maternal Uncle 40         PMS2 positive    Breast cancer Paternal Great-Grandmother 41    Thyroid cancer Paternal Great-Grandmother 42    Lymphoma Paternal Great-Grandmother 69    Colon cancer Paternal Great-Grandmother 76      CANCER RISKS IN PMS2 MUTATION CARRIERS   Women and men who carry mutations in the PMS2 gene are at a significantly increased  risk for developing several types of cancer associated with Lynch syndrome.     There is evidence of important variability in cancer risk among different families, even within the same variant in a specific Lynch syndrome causing gene. This variability may be due to shared biologic (e.g. genetic risk modifiers) and/or social and behavioral exposures. Thus, when assessing individual cancer risks, it is important to consider specific family history of cancer and factors shown to be associated with colorectal cancer risk, including key exposures (e.g. tobacco, alcohol), diet (e.g processed and red meat consumption) and lifestyle factors (e.g physical exercise) Museum/gallery exhibitions officer, Lancet Oncol 2021; 22: 1610-9604).     Below are cancer risk estimates in individuals with Lynch syndrome compared to the general population.  The following lifetime cancer risk information is adapted from the current [employer] (NCCN) guidelines.      Cancer Site Cumulative Risk for Diagnosis Through Lifetime for the General Population Cumulative Risk for Diagnosis through age 74y in PMS2 Mutation Carriers ** Estimated Average Age of Presentation in PMS2 Mutation Carriers  Comments    Colorectal   4.1%  8.7% - 20%  61-66 years Risk of first colon cancer    Endometrium  3.1%  13% - 26%  49-50 years      Ovary 1.1% 1.3-3% 51-59 years     Stomach  <1%  Inadequate data Inadequate data    Pancreas 1.7% <1% - 1.6% N/A    Bladder 2.3% <1% - 2.4% 71 years    Biliary Tract  N/A <1%  N/A    Renal pelvis and/or ureter  N/A <1% - 3.7% N/A     Small Bowel  <1%  <1%  Single case- 59 years    Prostate 12.6% 4.6% - 11.6% N/A    Brain / CNS  <1% <1% 40 years     N/A = Not available  The panel cautions that new data may confirm or change prior findings suggesting no increased risk, as more studies are needed to clarify lifetime risks for cancer in Lynch syndrome by mutation type. Point estimates for cancer risk  in many studies were associated with wide confidence intervals, and should be interpreted with caution.   **Cumulative risk among LS pathogenic variant carriers represents cumulative incidence based on available cohort studies. In some studies, the cumulative risks are through a younger age (I.e. 64 or 37). For some cancer sites, case series and other observational studies may have reported higher cumulative risks. In studies where no cases were identified, NCCN has represented the data as <1%.     There are insufficient data supporting an increased risk for breast cancer for women with Lynch syndrome. As a result, breast cancer is not included on the Lynch syndrome increased cancer risks table. Breast cancer risk management should be based on personal and family history.  Survival following diagnosis of colon, endometrial and ovarian cancers in Lynch syndrome appears to be good overall.  Further studies are needed to determine which factor(s) play a role in determining survival.      SCREENING & RISK-REDUCTION RECOMMENDATIONS   The following recommendations were reviewed with colleagues at our cancer genetics case conference. The recommendations are relevant to Pamela Montes given her current age and her gender and are primarily based on the current National Comprehensive Cancer Network (NCCN) guidelines. I will provide Pamela Montes with a printed copy of the NCCN surveillance guidelines as these may be useful to other relatives in the future.     Pamela Montes should review these recommendations in detail with her physicians to finalize a cancer screening and risk reduction plan. All recommendations should be considered within the context of her personal history and current health. Recommendations may change as personal and/or family histories of cancer change, as genetic test results become available for relative(s) and/or as medical science advances.  Given the patient's current young  age, these recommendations may change in the future and we encourage the patient to check in periodically for any updates.  The patient can also review these recommendations with her primary care and or gynecologist as needed.      Surveillance, no matter how well or carefully performed, does not guarantee that cancer will be detected at a stage that is curable or that will require minimal treatment. For this reason, individuals with organs at risk may consider risk-reducing surgeries to reduce the chance that they will develop cancer. While risk-reducing surgery generally lowers the risk of developing cancer, it does not guarantee that cancer will be prevented.     The most recent version of the NCCN guidelines recommend some consideration of gene-specific risks when planning screening and/or prevention. Although there are some mutation-specific data available, a generalized screening approach is suggested for many surveillance strategies. Screening and the option of risk-reducing surgeries should be individualized after risk assessment and counseling.  Please see below for details. It is important to note that, other than for colon and endometrial cancer, screening recommendations are based on expert opinion rather than evidence-based.     Colon Cancer Screening:   High-quality colonoscopy starting at age 27-35 y or 2-5 y prior to the earliest colon cancer if it is diagnosed before age 61 y and repeat every 1-3 years.There is little evidence to guide the timing of initiating screening relative to the youngest age of diagnosis in a relative and the timing should be individualized.   Patients who may benefit from a shorter 1-year versus longer 2-year interval include those with risk factors such as history of colorectal cancer, female sex, MLH1/MSH2 mutations, age >40 years, and/or history of adenoma(s).  NCCN recommends that all individuals with Lynch syndrome who have a risk for future colorectal cancer (I.e.  excluding those with prior total proctocolectomy) consider using daily aspirin to reduce their future risk of colorectal cancer. The decision to use aspirin for reduction of colorectal cancer risk in Lynch syndrome and the dose chosen should be made on an individual basis, including discussion of individual risks, benefits, adverse effects, and childbearing plans. In determining whether an individual with Lynch syndrome should take aspirin and in deciding on the appropriate dosing, NCCN recommends that providers carefully review patient-specific factors that may increase the risk of aspirin therapy - including, but not limited to increased age, prior allergy, concurrent use of antiplatelets/anticoagulants, and untreated H. pylori  or unconfirmed H. pylori eradication - as well as patient-specific factors that indicate a comparably low future cumulative risk of colorectal cancer (I.e. increased age, PMS2-associated Lynch syndrome, history of prior colectomy) and who may thus be less likely to experience significant benefit. We encourage Pamela Montes Wirkkala to review this information in detail with her physician(s). Colonoscopy may not be able to prevent all CRC in individuals with Lynch syndrome Gordy Clement et al, 2022). It has been hypothesized that this may be because some cancers do not form an intermediate adenoma Binnie Rail et al, 2018). However, available data have shown that exposure to colonoscopy can detect cancers at an early stage when they are more likely curable. Risk reducing colectomy may be discussed with a colorectal surgeon.  Endometrial Cancer Screening and Risk Reduction:  PMS2 mutation carriers appear to be at only a modestly increased risk of endometrial cancer in contrast to other Lynch syndrome genes.   Because endometrial cancer can often be detected early based on symptoms, women should be educated regarding the importance of prompt reporting and evaluation of any abnormal uterine bleeding or  postmenopausal bleeding. The evaluation of symptoms should include endometrial biopsy.   Hysterectomy has not been shown to reduce endometrial cancer mortality, but can reduce the incidence of endometrial cancer; therefore, hysterectomy is a risk reducing option that can be considered.   Timing of hysterectomy can be individualized based on whether childbearing is complete, co-morbidities, family history, and the Lynch syndrome gene, since endometrial cancer risks vary by pathogenic variant (see above for PMS2 gene specific risks).   Endometrial cancer screening does not have proven benefit in women with Lynch syndrome. However, endometrial biopsy is both highly sensitive and highly specific as a diagnostic procedure. Screening via endometrial biopsy every 1-2 years starting at age 76-35 y can be considered.  Transvaginal ultrasound to screen for endometrial cancer in postmenopausal women has not been shown to be sufficiently sensitive or specific as to support a positive recommendation, but may be considered at the clinician's discretion. Transvaginal ultrasound is not recommended as a screening tool in premenopausal women due to the wide range of endometrial stripe thickness throughout the normal menstrual cycle.     Ovarian Cancer screening and Risk Reduction:  Insufficient evidence exists to make a specific recommendation for risk-reducing salpingo-oophorectomy (RRSO) for PMS2 pathogenic variant carriers. PMS2 carriers appear to be at no greater than average risk for ovarian cancer, and may consider deferring surveillance and may reasonably elect not to have oophorectomy.   Bilateral salpingo-oophorectomy (BSO) may reduce the incidence of ovarian cancer. The decision to undergo a BSO as a risk-reducing option by women who have completed childbearing should be individualized and done with consultation with a gynecologist with expertise in Lynch syndrome. Timing of BSO should be individualized based on whether  childbearing is complete, menopause status, comorbitities, family history, and Lynch syndrome gene pathogenic variant, since ovarian cancer risks vary by mutated gene. (see above for PMS2 gene specific risks). Estrogen replacement after premenopausal oophorectomy may be considered.    Since there is no effective screening for ovarian cancer, women should be educated on the symptoms that might be associated with the development of ovarian cancer, including pelvic/abdominal pain, bloating, increased abdominal girth, difficulty eating, early satiety, or urinary frequency or urgency. Symptoms that persist for several weeks and are a change from a woman's baseline should prompt evaluation by her physician.   While there may be circumstances where clinicians find screening helpful (such as if a family  history of ovarian cancer is present), data do not support routine ovarian cancer screening for Lynch syndrome. Transvaginal ultrasound for ovarian cancer screening has not been shown to be sufficiently sensitive or specific as to support a routine recommendation, but may be considered at the clinician's discretion. Serum CA-125 is an additional ovarian screening test with caveats similar to transvaginal ultrasound.   Consider risk reduction agents for endometrial and ovarian cancers, including discussion of risks and benefits. Data suggest that prior use of oral contraceptives reduces the risk for developing ovarian cancer in other mutation carriers (i.e. BRCA) by about 50%, although this has not been specifically studied in the context of Lynch syndrome. It is important to review the associated risk and benefits, in particular conflicting data linking their use to an increased risk of developing breast cancer.     Gastric & Small Bowel Cancer Screening:   Consider upper GI surveillance with EGD starting at age 82-40 years and repeat every 2-4 years, preferably performed in conjunction with colonoscopy. Age of initiation  prior to 30 years and/or surveillance interval less than 2 years may be considered based on family history of upper GI cancers or high-risk endoscopic findings (such as incomplete or extensive gastric intestinal metaplasia (GIM), gastric or duodenal adenomas, or Barrett esophagus with dysplasia). Random biopsy of the proximal and distal stomach should at minimum be performed on the initial procedure to assess for H.pylori (with treatment indicated if H.pylori is detected), autoimmune gastritis, and intestinal metaplasia. Push enteroscopy can be considered in place of EGD to enhance small bowel visualization, although its incremental yield for detection of neoplasia over EGD remains uncertain. There are limited available data on upper GI cancer risk in PMS2-related Lynch syndrome, and new evidence is likely to inform changes to these recommendations in the future.   Individuals not undergoing upper endoscopic surveillance should have one-time noninvasive testing for H.pylori at the time of Lynch syndrome diagnosis, with treatment indicated if H.pylori is detected. The value of eradication for the prevention of gastric cancer in Lynch syndrome is unknown.     Urothelial Cancer Screening:   There is no clear evidence to support surveillance for urothelial cancers in individuals with Lynch syndrome. Surveillance may be considered in selected individuals, such as those with a family history of urothelial (bladder, kidney, ureter) cancer. Surveillance options may include annual urinalysis starting at age 63-35y. However, there is insufficient evidence to recommend a particular surveillance strategy.     Given that Pamela Miu Cueto reports no family history of urothelial cancers and that she has an PMS2 gene mutation, the above surveillance may not be of benefit. I encouraged her to discuss the options reviewed above with her physician(s).    Central Nervous System Cancer Screening:   Patients should be educated  regarding signs and symptoms of neurologic cancer and the importance of prompt reporting of abnormal symptoms to their physicians. The possible symptoms may include, but are not limited to: headache, nausea, vomiting, blurred vision, balance problems, personality or behavior changes, seizures, drowsiness or even coma.     Pancreatic Cancer Screening:   NCCN does not currently recommend pancreatic cancer screening for carriers of PMS2 gene mutations in the absence of a close family history of pancreatic cancer.  If Graycen Degan Durfee is concerned, she can discuss screening with her physician(s). Studies have typically started screening with contrast-enhanced MRI/magnetic resonance cholangiopancreatography (MRCP) and/or endoscopic ultrasound (EUS) in high risk individuals. A referral for discussion of surveillance options can be provided  as needed.    Breast Cancer Screening:   There have been suggestions that there is an increased risk for breast cancer in Lynch syndrome patients; however, there is not enough evidence to support increased screening above average-risk breast cancer screening recommendations or those based on personal/family history of breast cancer.    Skin Cancer Screening:   Frequency of malignant and benign skin tumors such as sebaceous adenocarcinomas, sebaceous adenomas, and keratoacanthomas has been reported to be increased among patients with Lynch syndrome, but cumulative lifetime risk and median age of presentation are uncertain.  Further, an elevated risk of sebaceous tumors and keratoacanthoma has not been documented for PMS2 carriers.   Consider skin exam every 1-2 years with a health care provider skilled in identifying Lynch syndrome-associated skin manifestations. Age to start surveillance is uncertain and can be individualized.     [facility]- [phone]    Prostate Cancer Screening  Men with Lynch syndrome should consider their risk based on the Lynch  syndrome gene and family history of prostate cancer. The NCCN Guidelines for Prostate Cancer Early Detection recommend that it is reasonable for men with Lynch syndrome to consider beginning shared decision-making about prostate cancer screening (PSA and Digital Rectal Exam (DRE)) at age 15 years and to consider screening at annual intervals rather than every other year. Pamela Miu Bourdon should review this information with his physician(s), and should follow up with [place] in the future to discuss any available updates, since prostate cancer surveillance guidelines could evolve over time.   Lifestyle Modifiers  In addition to family history, lifestyle factors may also influence cancer risk. These include diet, exercise and exposure to tobacco and alcohol. It is therefore important that Pamela Miu Kloeppel maintain a well-balanced diet, try to exercise on a regular basis, limit alcohol consumption, and also avoid tobacco use.     In general, Keydi Giel Petrea should undergo a careful review of systems and general examination with her physicians to elicit any unexplained signs or symptoms (such as abdominal pain, change in bowel habits, unexpected vaginal bleeding>>, skin lesions, etc.) that may warrant further evaluation for Lynch syndrome related malignancies.      IMPLICATIONS FOR RELATIVES  Each of Madelene Kaatz Speedy's first-degree relatives has a 50% risk of having inherited the PMS2 mutation. This includes her younger siblings.  But, we do not recommend genetic testing until the age of 20 or older.      More extended maternal relatives may also be at risk for having inherited the PMS2 mutation.      We discussed the importance of prenatal genetic counseling for the patient and any relatives of childbearing age to include a review of her partner's personal and family histories, given that inheritance of two PMS2 mutations is associated with a rare (recessive) condition known as constitutional mismatch  repair-deficiency (CMMR-D) syndrome.     PSYCHOSOCIAL CONSIDERATIONS  It is normal for Pamela Miu Nilson to have a range of emotions as she considers the implications of this positive genetic test result.  We are available if Pamela Miu Tomasello or her physicians have other questions about this test result and the material that was discussed. If she would also like to discuss her cancer diagnosis and/or genetic test result and its implications in more detail with a mental health professional, a referral can be provided.      RESEARCH OPTIONS  Given Latisa Belay Sadiq's genetic test result, she may choose to submit information to  the newly developed Prospective Registry Of [facility] Testing (PROMPT), an online registry for individuals and families who have undergone testing for inherited cancer-causing genetic mutations. Co-founded by researchers at [facility], [place] [facility] [place] and the [employer], the PROMPT registry includes a database where patient information is gathered in one place,  to allow research teams across the country to try to better understand the cancer risks associated with both established and newly discovered genes, and to ideally design more effective studies in the future.  Participants are able to choose how much personal information they provide.  Even if patients and their relatives participate anonymously, their data will contribute to the collective understanding of inherited cancer risk for current and future generations.  Information about the PROMPT registry is provided at www.promptstudy.org.  This study is not specific to mutations or variants identified in the PMS2 gene.    [employer] is a Personnel officer syndrome community with the mission to improve the lives of individuals and families affected by Lynch Syndrome and associated cancers through research, education, and screening. For research, their HEROIC Registry allows patients to contribute  medical information and their experiences living with Lynch Syndrome and its associated cancers to help researchers develop new treatments, understand the various Lynch genetic mutations, write medical papers and conduct further studies and clinical trials.  You can learn more information and/or sign-up to participate in the HEROIC registry through their website [employer].    SUPPORT RESOURCES  [facility]). [facility] provides information and support for families with hereditary colon cancer syndromes through their website (http://www.hcctakesguts.org).     [place]. Albina Billet is an Landscape architect for individualsand their families with hereditary cancer conditions which helps patients to effectively communicate information to their at-risk family members.   [email or web address]    [employer]acing [employer] of [employer] ([employer]). [employer] provides information and support for families with hereditary cancer syndromes through their website (www.facingourrisk.org) and via in-person support groups.  There is a local chapter of [employer].    FOLLOW UP  The frequency of colonoscopy in the future should be every 1-2 years as discussed above.    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 Dwight Burdo Tschirhart is also encouraged to re-contact us if she would like to talk further about this test result and its implications, or if any additional referrals are needed.  I encouraged Ahlayah Tarkowski Cretella to share this test result with at-risk relatives.  She understood the importance of this recommendation and indicated intent to talk to relatives.  We certainly understand that this information can be emotionally and technically difficult to discuss within a family, and we are available to Pamela Miu Apple to offer whatever support may be needed.     Relatives should ideally be provided with a copy of Eisa Conaway Favela's test result report as this information will be critical to the accuracy of their subsequent testing and individualized risk assessment.    We are happy to see any at-risk relatives who live locally or can provide contact information for genetic counselors in their area.  Interested relatives may call [phone] to schedule an appointment.  Tephanie Escorcia Seeley should let us know if there are any changes to her personal and/or family histories, including if anyone else in the family pursues genetic testing for Lynch syndrome elsewhere (no matter what the result) as these could  influence the current risk assessment.    A copy of Aneesa Romey Treu's test result will be provided to her and the result will be shared with her physicians as discussed.  A copy of this post-test result summary will also be provided to [facility], along with a copy of the current NCCN guidelines.    I remain available for any questions and/or concerns that may arise.    Jorje Vanatta A. Dimas Scheck, MS, CGC  Certified and Licensed Press photographer, P[employer] [facility]  [address]  [address]  [place], [place] [address]  [phone] - phone  [phone] - fax  [email or web address]

## 2022-03-09 ENCOUNTER — Ambulatory Visit (INDEPENDENT_AMBULATORY_CARE_PROVIDER_SITE_OTHER): Payer: BC Managed Care – PPO | Admitting: Clinical Genetics (M.D.)

## 2023-07-24 ENCOUNTER — Other Ambulatory Visit (FREE_STANDING_LABORATORY_FACILITY): Payer: Self-pay | Admitting: Student in an Organized Health Care Education/Training Program

## 2023-07-24 LAB — HEPATITIS B SURFACE (HBV) ANTIBODY, QUANTITATIVE: Hepatitis B Surface Antibody: <3.31 m[IU]/mL

## 2023-07-27 LAB — QUANTIFERON(R) - TB GOLD PLUS
Mitogen-NIL: 4.87 [IU]/mL
NIL: 0.03 [IU]/mL
Quantiferon TB Gold Plus: NEGATIVE
TB1-NIL: 0 [IU]/mL
TB2-NIL: 0 [IU]/mL

## 2023-08-22 ENCOUNTER — Encounter (INDEPENDENT_AMBULATORY_CARE_PROVIDER_SITE_OTHER): Payer: Self-pay | Admitting: Internal Medicine

## 2023-08-22 ENCOUNTER — Ambulatory Visit (INDEPENDENT_AMBULATORY_CARE_PROVIDER_SITE_OTHER): Admitting: Internal Medicine

## 2023-08-22 VITALS — BP 126/80 | HR 92 | Temp 98.8°F | Ht 64.76 in | Wt 161.0 lb

## 2023-08-22 DIAGNOSIS — Z0000 Encounter for general adult medical examination without abnormal findings: Secondary | ICD-10-CM

## 2023-08-22 DIAGNOSIS — R5383 Other fatigue: Secondary | ICD-10-CM

## 2023-08-22 NOTE — Progress Notes (Signed)
 [facility]   OFFICE VISIT                HPI     Chief Complaint   Patient presents with   . Annual Exam     Not fasting. Patient specifically concerned about thyroid levels due to family history, and experiencing symptoms of unexplained weight gain, brittle nails, fatigue, and hair shedding x9 months.       New patient to me.  Transferring from pediatrics.    23 y.o. female here today for annual examination.      Eye exam: no glasses  Dental exam: up to date  Tetanus: not up to date    Colon cancer screening: not applicable due to age  Breast cancer screening: not applicable due to age  Cervical cancer screening: not up to date    Concerns:    Patient reports family history of Hashimoto thyroiditis.  Reports she has had hair loss more so than usual.  Feels that she has gained weight.  Nails feel brittle.  Feels fatigued.        ROS   Review of Systems   Constitutional:  Negative for chills and fever.   HENT:  Negative for congestion, rhinorrhea and sore throat.    Eyes:  Negative for visual disturbance.   Respiratory:  Negative for cough and shortness of breath.    Cardiovascular:  Negative for chest pain.   Gastrointestinal:  Negative for abdominal pain, diarrhea, nausea and vomiting.   Genitourinary:  Negative for dysuria, frequency and urgency.   Musculoskeletal:  Negative for back pain, gait problem and myalgias.   Skin:  Negative for rash.   Neurological:  Negative for weakness, light-headedness and headaches.   All other systems reviewed and are negative.      Vital Signs   BP 126/80 (BP Site: Right arm, Patient Position: Sitting, Cuff Size: Large)   Pulse 92   Temp 98.8 F (37.1 C) (Oral)   Ht 1.645 m (5' 4.76)   Wt 73 kg (161 lb)   LMP 08/17/2023 (Exact Date)   BMI 26.99 kg/m     Physical Exam   Physical Exam  Vitals and nursing note reviewed. Exam conducted with a chaperone present.   Constitutional:       Appearance: Normal appearance. She is normal weight.   HENT:      Head:  Normocephalic and atraumatic.      Right Ear: External ear normal.      Left Ear: External ear normal.      Nose: Nose normal.      Mouth/Throat:      Mouth: Mucous membranes are moist.      Pharynx: Oropharynx is clear.   Eyes:      Extraocular Movements: Extraocular movements intact.      Conjunctiva/sclera: Conjunctivae normal.      Pupils: Pupils are equal, round, and reactive to light.   Cardiovascular:      Rate and Rhythm: Normal rate and regular rhythm.      Pulses: Normal pulses.      Heart sounds: No murmur heard.  Pulmonary:      Effort: Pulmonary effort is normal.      Breath sounds: Normal breath sounds. No wheezing.   Abdominal:      General: Abdomen is flat. Bowel sounds are normal.      Palpations: Abdomen is soft.   Musculoskeletal:         General: Normal range of  motion.      Cervical back: Neck supple.      Right lower leg: No edema.      Left lower leg: No edema.   Skin:     General: Skin is warm.      Capillary Refill: Capillary refill takes less than 2 seconds.      Findings: No rash.   Neurological:      General: No focal deficit present.      Mental Status: She is alert.   Psychiatric:         Mood and Affect: Mood normal.         Assessment/Plan     Assessment & Plan  Physical exam, annual    Orders:  .  CBC with Differential (Order); Future  .  Comprehensive Metabolic Panel; Future  .  Hemoglobin A1C; Future  .  Lipid Panel; Future    Labs as above.  Healthy diet and exercise emphasized - advised 150 minutes of exercise weekly for cardiovascular health.  Advised importance of taking any prescribed medications as directed.  Advised to have screening tests done as recommended for health maintenance.

## 2023-08-22 NOTE — Progress Notes (Signed)
 Have you seen any specialists/other providers since your last visit with us ?    No    Arm preference verified?   Yes, no preference    Health Maintenance Due   Topic Date Due    Pap Smear  Never done    COVID-19 Vaccine (3 - 2024-25 season) 11/13/2022

## 2023-08-24 ENCOUNTER — Other Ambulatory Visit (FREE_STANDING_LABORATORY_FACILITY)

## 2023-08-24 DIAGNOSIS — Z0000 Encounter for general adult medical examination without abnormal findings: Secondary | ICD-10-CM

## 2023-08-24 DIAGNOSIS — R5383 Other fatigue: Secondary | ICD-10-CM

## 2023-08-24 LAB — COMPREHENSIVE METABOLIC PANEL
ALT: 19 U/L (ref <=55)
AST (SGOT): 25 U/L (ref <=41)
Albumin/Globulin Ratio: 1.3 (ref 0.9–2.2)
Albumin: 4.1 g/dL (ref 3.5–5.0)
Alkaline Phosphatase: 79 U/L (ref 37–117)
Anion Gap: 9 (ref 5.0–15.0)
BUN: 12 mg/dL (ref 7–21)
Bilirubin, Total: 0.5 mg/dL (ref 0.2–1.2)
CO2: 25 meq/L (ref 17–29)
Calcium: 9.2 mg/dL (ref 8.5–10.5)
Chloride: 107 meq/L (ref 99–111)
Creatinine: 0.7 mg/dL (ref 0.4–1.0)
GFR: >60 mL/min/1.73 m2 (ref >=60.0)
Globulin: 3.1 g/dL (ref 2.0–3.6)
Glucose: 82 mg/dL (ref 70–100)
Hemolysis Index: 8 {index}
Potassium: 4.5 meq/L (ref 3.5–5.3)
Protein, Total: 7.2 g/dL (ref 6.0–8.3)
Sodium: 141 meq/L (ref 135–145)

## 2023-08-24 LAB — LAB USE ONLY - CBC WITH DIFFERENTIAL
Absolute Basophils: 0.03 x10 3/uL (ref 0.00–0.08)
Absolute Eosinophils: 0.28 x10 3/uL (ref 0.00–0.44)
Absolute Immature Granulocytes: 0.03 x10 3/uL (ref 0.00–0.07)
Absolute Lymphocytes: 1.52 x10 3/uL (ref 0.42–3.22)
Absolute Monocytes: 0.54 x10 3/uL (ref 0.21–0.85)
Absolute Neutrophils: 5.93 x10 3/uL (ref 1.10–6.33)
Absolute nRBC: 0 x10 3/uL (ref <=0.00)
Basophils %: 0.4 %
Eosinophils %: 3.4 %
Hematocrit: 35.8 % (ref 34.7–43.7)
Hemoglobin: 11.3 g/dL — ABNORMAL LOW (ref 11.4–14.8)
Immature Granulocytes %: 0.4 %
Lymphocytes %: 18.2 %
MCH: 26 pg (ref 25.1–33.5)
MCHC: 31.6 g/dL (ref 31.5–35.8)
MCV: 82.3 fL (ref 78.0–96.0)
MPV: 9.5 fL (ref 8.9–12.5)
Monocytes %: 6.5 %
Neutrophils %: 71.1 %
Platelet Count: 303 x10 3/uL (ref 142–346)
Preliminary Absolute Neutrophil Count: 5.93 x10 3/uL (ref 1.10–6.33)
RBC: 4.35 x10 6/uL (ref 3.90–5.10)
RDW: 16 % — ABNORMAL HIGH (ref 11–15)
WBC: 8.33 x10 3/uL (ref 3.10–9.50)
nRBC %: 0 /100{WBCs} (ref <=0.0)

## 2023-08-24 LAB — LIPID PANEL
Cholesterol / HDL Ratio: 3.2 {index}
Cholesterol: 165 mg/dL (ref <=199)
HDL: 51 mg/dL (ref >=40)
LDL Calculated: 107 mg/dL — ABNORMAL HIGH (ref 0–99)
Triglycerides: 33 mg/dL — ABNORMAL LOW (ref 34–149)
VLDL Calculated: 7 mg/dL — ABNORMAL LOW (ref 10–40)

## 2023-08-24 LAB — THYROID STIMULATING HORMONE (TSH) WITH REFLEX TO FREE T4: TSH: 4.31 u[IU]/mL (ref 0.35–4.94)

## 2023-08-25 ENCOUNTER — Ambulatory Visit (INDEPENDENT_AMBULATORY_CARE_PROVIDER_SITE_OTHER): Payer: Self-pay | Admitting: Internal Medicine

## 2023-08-25 LAB — HEMOGLOBIN A1C
Average Estimated Glucose: 105.4 mg/dL
Hemoglobin A1C: 5.3 % (ref 4.6–5.6)

## 2023-09-21 ENCOUNTER — Encounter (INDEPENDENT_AMBULATORY_CARE_PROVIDER_SITE_OTHER): Payer: Self-pay

## 2023-09-22 ENCOUNTER — Ambulatory Visit (INDEPENDENT_AMBULATORY_CARE_PROVIDER_SITE_OTHER): Payer: Self-pay

## 2023-12-15 ENCOUNTER — Other Ambulatory Visit (INDEPENDENT_AMBULATORY_CARE_PROVIDER_SITE_OTHER): Payer: Self-pay | Admitting: Internal Medicine
